# Patient Record
Sex: Female | Born: 1960 | Race: Black or African American | Hispanic: No | Smoking: Former smoker
Health system: Southern US, Community
[De-identification: ages and names within clinical notes are randomized; demographics above are authoritative.]

## PROBLEM LIST (undated history)

## (undated) DIAGNOSIS — I10 Essential (primary) hypertension: Secondary | ICD-10-CM

## (undated) DIAGNOSIS — E7800 Pure hypercholesterolemia, unspecified: Secondary | ICD-10-CM

---

## 2011-01-25 ENCOUNTER — Emergency Department (HOSPITAL_BASED_OUTPATIENT_CLINIC_OR_DEPARTMENT_OTHER)
Admission: EM | Admit: 2011-01-25 | Discharge: 2011-01-25 | Disposition: A | Discharge status: Home / Self Care | Payer: Federal, State, Local not specified - PPO | Attending: Emergency Medicine | Admitting: Emergency Medicine

## 2011-01-25 ENCOUNTER — Emergency Department (INDEPENDENT_AMBULATORY_CARE_PROVIDER_SITE_OTHER): Payer: Federal, State, Local not specified - PPO

## 2011-01-25 ENCOUNTER — Encounter: Payer: Self-pay | Admitting: Emergency Medicine

## 2011-01-25 DIAGNOSIS — R110 Nausea: Secondary | ICD-10-CM | POA: Insufficient documentation

## 2011-01-25 DIAGNOSIS — R51 Headache: Secondary | ICD-10-CM

## 2011-01-25 MED ORDER — DEXAMETHASONE SODIUM PHOSPHATE 10 MG/ML IJ SOLN
10.0000 mg | Freq: Once | INTRAMUSCULAR | Status: AC
  Administered 2011-01-25: 10 mg via INTRAMUSCULAR
  Filled 2011-01-25: qty 1

## 2011-01-25 MED ORDER — DIPHENHYDRAMINE HCL 50 MG/ML IJ SOLN
25.0000 mg | Freq: Once | INTRAMUSCULAR | Status: AC
Start: 2011-01-25 — End: 2011-01-25
  Administered 2011-01-25: 25 mg via INTRAVENOUS
  Filled 2011-01-25: qty 1

## 2011-01-25 MED ORDER — METOCLOPRAMIDE HCL 5 MG/ML IJ SOLN
10.0000 mg | Freq: Once | INTRAMUSCULAR | Status: AC
  Administered 2011-01-25: 10 mg via INTRAMUSCULAR
  Filled 2011-01-25: qty 2

## 2011-01-25 NOTE — ED Provider Notes (Signed)
History     CSN: [id number] Arrival date & time: 01/25/2011 11:57 AM  Chief Complaint  Patient presents with  . Headache    pt reports headaches with numbness to extremities     (Consider location/radiation/quality/duration/timing/severity/associated sxs/prior treatment) HPI Comments: Pt states that she just feels like she is dragging this morning:pt states that she doesn't usually have headaches that last this long:pt states that she was hyperventilating this morning and both hands were numb and tingling, but that sensation has resolved:pt denies as visually changes, speech changes or weakness to extremities  Patient is a 50 y.o. female presenting with headaches. The history is provided by the patient. No language interpreter was used.  Headache  This is a new problem. The current episode started 6 to 12 hours ago. The problem occurs constantly. The problem has not changed since onset.The headache is associated with emotional stress. The pain is located in the right unilateral region. The quality of the pain is described as sharp. The pain is severe. The pain radiates to the face. Associated symptoms include nausea. Pertinent negatives include no fever, no near-syncope, no shortness of breath and no vomiting. She has tried NSAIDs for the symptoms. The treatment provided moderate relief.    History reviewed. No pertinent past medical history.  History reviewed. No pertinent past surgical history.  Family History  Problem Relation Age of Onset  . Diabetes Mother   . Hyperlipidemia Father     History  Substance Use Topics  . Smoking status: Never Smoker   . Smokeless tobacco: Not on file  . Alcohol Use: No    OB History    Grav Para Term Preterm Abortions TAB SAB Ect Mult Living                  Review of Systems  Constitutional: Negative for fever.  Respiratory: Negative for shortness of breath.   Cardiovascular: Negative for near-syncope.  Gastrointestinal: Positive for  nausea. Negative for vomiting.  Neurological: Positive for headaches.  All other systems reviewed and are negative.    Allergies  Aspirin  Home Medications  No current outpatient prescriptions on file.  BP 159/82  Pulse 75  Temp(Src) 98.2 F (36.8 C) (Oral)  Resp 20  SpO2 100%  Physical Exam  Nursing note and vitals reviewed. Constitutional: She is oriented to person, place, and time. She appears well-developed and well-nourished.  HENT:  Head: Normocephalic and atraumatic.  Right Ear: External ear normal.  Left Ear: External ear normal.  Mouth/Throat: Oropharynx is clear and moist.  Eyes: Pupils are equal, round, and reactive to light.  Neck: Normal range of motion. Neck supple.  Cardiovascular: Normal rate and regular rhythm.   Pulmonary/Chest: Effort normal and breath sounds normal.  Abdominal: Soft. Bowel sounds are normal.  Musculoskeletal: Normal range of motion.  Neurological: She is alert and oriented to person, place, and time.  Skin: Skin is warm and dry.  Psychiatric: She has a normal mood and affect.    ED Course  Procedures (including critical care time)  Labs Reviewed - No data to display Ct Head Wo Contrast  01/25/2011  *RADIOLOGY REPORT*  Clinical Data: Headaches, nausea  CT HEAD WITHOUT CONTRAST  Technique:  Contiguous axial images were obtained from the base of the skull through the vertex without contrast.  Comparison: None.  Findings: No evidence of parenchymal hemorrhage or extra-axial fluid collection. No mass lesion, mass effect, or midline shift.  No CT evidence of acute infarction.  Cerebral volume  is age appropriate.  No ventriculomegaly.  The visualized paranasal sinuses are essentially clear. The mastoid air cells are unopacified.  No evidence of calvarial fracture.  IMPRESSION: Normal head CT.  Original Report Authenticated By: Charline Bills, M.D.     1. Headache       MDM  Pt is feeling better at this time:no acute findings  noted:pt not having any neuro deficits        Teressa Lower, NP 01/25/11 1351

## 2011-01-25 NOTE — ED Provider Notes (Signed)
Medical screening examination/treatment/procedure(s) were performed by non-physician practitioner and as supervising physician I was immediately available for consultation/collaboration.   Carl Bleecker A Terance Pomplun, MD 01/25/11 1927 

## 2011-01-25 NOTE — ED Notes (Signed)
Care plan and follow up reviewed with Pt pleasant pain free

## 2011-01-25 NOTE — ED Notes (Signed)
Pt reports headache with hyperventilation and numbness to extremities

## 2011-01-25 NOTE — ED Notes (Signed)
CO Headache and Numbness

## 2016-09-13 ENCOUNTER — Emergency Department (HOSPITAL_BASED_OUTPATIENT_CLINIC_OR_DEPARTMENT_OTHER): Payer: Federal, State, Local not specified - PPO

## 2016-09-13 ENCOUNTER — Encounter (HOSPITAL_BASED_OUTPATIENT_CLINIC_OR_DEPARTMENT_OTHER): Payer: Self-pay | Admitting: Emergency Medicine

## 2016-09-13 ENCOUNTER — Emergency Department (HOSPITAL_BASED_OUTPATIENT_CLINIC_OR_DEPARTMENT_OTHER)
Admission: EM | Admit: 2016-09-13 | Discharge: 2016-09-13 | Disposition: A | Discharge status: Home / Self Care | Payer: Federal, State, Local not specified - PPO | Attending: Emergency Medicine | Admitting: Emergency Medicine

## 2016-09-13 DIAGNOSIS — S3992XA Unspecified injury of lower back, initial encounter: Secondary | ICD-10-CM | POA: Diagnosis present

## 2016-09-13 DIAGNOSIS — Y939 Activity, unspecified: Secondary | ICD-10-CM | POA: Insufficient documentation

## 2016-09-13 DIAGNOSIS — S39012A Strain of muscle, fascia and tendon of lower back, initial encounter: Secondary | ICD-10-CM | POA: Insufficient documentation

## 2016-09-13 DIAGNOSIS — Y999 Unspecified external cause status: Secondary | ICD-10-CM | POA: Diagnosis not present

## 2016-09-13 DIAGNOSIS — Y929 Unspecified place or not applicable: Secondary | ICD-10-CM | POA: Insufficient documentation

## 2016-09-13 MED ORDER — IBUPROFEN 600 MG PO TABS: 600.0000 mg | ORAL_TABLET | Freq: Four times a day (QID) | ORAL | 0 refills | Status: AC | PRN

## 2016-09-13 MED ORDER — METHOCARBAMOL 500 MG PO TABS: 500.0000 mg | ORAL_TABLET | Freq: Two times a day (BID) | ORAL | 0 refills | Status: AC

## 2016-09-13 MED ORDER — IBUPROFEN 400 MG PO TABS
600.0000 mg | ORAL_TABLET | Freq: Once | ORAL | Status: AC
  Administered 2016-09-13: 600 mg via ORAL
  Filled 2016-09-13: qty 1
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
## 2018-03-04 ENCOUNTER — Emergency Department (HOSPITAL_BASED_OUTPATIENT_CLINIC_OR_DEPARTMENT_OTHER)
Admission: EM | Admit: 2018-03-04 | Discharge: 2018-03-04 | Disposition: A | Discharge status: Home / Self Care | Payer: Federal, State, Local not specified - PPO | Attending: Emergency Medicine | Admitting: Emergency Medicine

## 2018-03-04 ENCOUNTER — Other Ambulatory Visit: Payer: Self-pay

## 2018-03-04 ENCOUNTER — Encounter (HOSPITAL_BASED_OUTPATIENT_CLINIC_OR_DEPARTMENT_OTHER): Payer: Self-pay

## 2018-03-04 ENCOUNTER — Emergency Department (HOSPITAL_BASED_OUTPATIENT_CLINIC_OR_DEPARTMENT_OTHER): Payer: Federal, State, Local not specified - PPO

## 2018-03-04 DIAGNOSIS — I10 Essential (primary) hypertension: Secondary | ICD-10-CM | POA: Insufficient documentation

## 2018-03-04 DIAGNOSIS — R079 Chest pain, unspecified: Secondary | ICD-10-CM | POA: Insufficient documentation

## 2018-03-04 DIAGNOSIS — Z87891 Personal history of nicotine dependence: Secondary | ICD-10-CM | POA: Insufficient documentation

## 2018-03-04 HISTORY — DX: Essential (primary) hypertension: I10

## 2018-03-04 HISTORY — DX: Pure hypercholesterolemia, unspecified: E78.00

## 2018-03-04 LAB — CBC
HCT: 45.4 % (ref 36.0–46.0)
Hemoglobin: 14.5 g/dL (ref 12.0–15.0)
MCH: 30.7 pg (ref 26.0–34.0)
MCHC: 31.9 g/dL (ref 30.0–36.0)
MCV: 96 fL (ref 80.0–100.0)
PLATELETS: 233 10*3/uL (ref 150–400)
RBC: 4.73 MIL/uL (ref 3.87–5.11)
RDW: 13.8 % (ref 11.5–15.5)
WBC: 5.3 10*3/uL (ref 4.0–10.5)
nRBC: 0 % (ref 0.0–0.2)

## 2018-03-04 LAB — BASIC METABOLIC PANEL
Anion gap: 12 (ref 5–15)
BUN: 15 mg/dL (ref 6–20)
CALCIUM: 9.7 mg/dL (ref 8.9–10.3)
CHLORIDE: 105 mmol/L (ref 98–111)
CO2: 26 mmol/L (ref 22–32)
CREATININE: 0.8 mg/dL (ref 0.44–1.00)
GFR calc non Af Amer: >60 mL/min (ref >60)
Glucose, Bld: 87 mg/dL (ref 70–99)
Potassium: 4.6 mmol/L (ref 3.5–5.1)
SODIUM: 143 mmol/L (ref 135–145)

## 2018-03-04 LAB — TROPONIN I: Troponin I: <0.03 ng/mL (ref <0.03)

## 2018-03-04 NOTE — ED Triage Notes (Signed)
C/o CP x "over a week"-worse last night-seen by PCP today and sent to ED-pt NAD-steady gait

## 2018-03-04 NOTE — Discharge Instructions (Addendum)
Make sure that you follow-up with your primary care physician tomorrow.  Your initial blood work looks okay but you need further heart testing for your chest pain.  Start taking your blood pressure medicines that were prescribed by your primary care provider.  Return to the emergency room if you have any worsening symptoms or if you change your mind about further testing that was discussed.

## 2018-03-04 NOTE — ED Provider Notes (Signed)
[facility] EMERGENCY DEPARTMENT Provider Note   CSN: [id number] Arrival date & time: 03/04/18  1520     History   Chief Complaint Chief Complaint  Patient presents with  . Chest Pain    HPI Kelsey Hubbard is a 57 y.o. female.  Patient is a 57 year old female with a history of hypertension and hyperlipidemia who presents with elevated blood pressures.  She was diagnosed with hypertension about a year ago and was prescribed medication but she never started taking it.  She was trying to control her blood pressure and other ways.  She states that she has had some intermittent chest pain which she describes as pressure across her chest for several months.  It seems to have been worse over the last week.  She has had some increased fatigue.  Her chest pain is been a little more constant but still is intermittent over the last 2 to 3 days.  She has been still exercising and she states that yesterday when she exercise she felt bad but it did not necessarily make her chest pain worse.  She has some vague shortness of breath associated with the discomfort.  She has some intermittent discomfort to the right side of her neck which she also says is been going on for the last several months.  It comes and goes and seems to be worse when she is more stressed.  She denies any current pain.  Last night when she had the chest pain she had some numbness in her right arm which has currently resolved.  She denies any other numbness or weakness to her extremities.  No balance issues.  No headache.  No vision changes.  No speech deficits.  She went to her PCPs office today and her blood pressure was noted to be over 200 systolic.  She was given a dose of oral clonidine and sent here for further treatment.     Past Medical History:  Diagnosis Date  . High cholesterol   . Hypertension     There are no active problems to display for this patient.   Past Surgical History:  Procedure Laterality  Date  . CESAREAN SECTION       OB History   None      Home Medications    Prior to Admission medications   Medication Sig Start Date End Date Taking? Authorizing Provider  ibuprofen (ADVIL,MOTRIN) 600 MG tablet Take 1 tablet (600 mg total) by mouth every 6 (six) hours as needed. 09/13/16   Law, Waylan Boga, PA-C  methocarbamol (ROBAXIN) 500 MG tablet Take 1 tablet (500 mg total) by mouth 2 (two) times daily. 09/13/16   Emi Holes, PA-C    Family History Family History  Problem Relation Age of Onset  . Diabetes Mother   . Hyperlipidemia Father     Social History Social History   Tobacco Use  . Smoking status: Former Games developer  . Smokeless tobacco: Never Used  Substance Use Topics  . Alcohol use: Yes    Comment: weekly  . Drug use: No     Allergies   Aspirin   Review of Systems Review of Systems  Constitutional: Positive for fatigue. Negative for chills, diaphoresis and fever.  HENT: Negative for congestion, rhinorrhea and sneezing.   Eyes: Negative.   Respiratory: Positive for shortness of breath. Negative for cough and chest tightness.   Cardiovascular: Positive for chest pain. Negative for leg swelling.  Gastrointestinal: Negative for abdominal pain, blood in stool, diarrhea,  nausea and vomiting.  Genitourinary: Negative for difficulty urinating, flank pain, frequency and hematuria.  Musculoskeletal: Negative for arthralgias and back pain.  Skin: Negative for rash.  Neurological: Positive for numbness. Negative for dizziness, speech difficulty, weakness and headaches.     Physical Exam Updated Vital Signs BP (!) 163/81   Pulse (!) 59   Temp 97.9 F (36.6 C) (Oral)   Resp 18   SpO2 100%   Physical Exam  Constitutional: She is oriented to person, place, and time. She appears well-developed and well-nourished.  HENT:  Head: Normocephalic and atraumatic.  Eyes: Pupils are equal, round, and reactive to light.  Neck: Normal range of motion. Neck  supple.  Cardiovascular: Normal rate, regular rhythm and normal heart sounds.  Pulmonary/Chest: Effort normal and breath sounds normal. No respiratory distress. She has no wheezes. She has no rales. She exhibits no tenderness.  Abdominal: Soft. Bowel sounds are normal. There is no tenderness. There is no rebound and no guarding.  Musculoskeletal: Normal range of motion. She exhibits no edema.  Lymphadenopathy:    She has no cervical adenopathy.  Neurological: She is alert and oriented to person, place, and time.  Motor 5/5 all extremities Sensation grossly intact to LT all extremities Finger to Nose intact, no pronator drift CN II-XII grossly intact    Skin: Skin is warm and dry. No rash noted.  Psychiatric: She has a normal mood and affect.     ED Treatments / Results  Labs (all labs ordered are listed, but only abnormal results are displayed) Labs Reviewed  CBC  TROPONIN I  BASIC METABOLIC PANEL    EKG EKG Interpretation  Date/Time:  Thursday March 04 2018 15:31:10 EST Ventricular Rate:  65 PR Interval:  148 QRS Duration: 86 QT Interval:  424 QTC Calculation: 440 R Axis:   70 Text Interpretation:  Normal sinus rhythm Normal ECG No old tracing to compare Confirmed by Rolan BuccoBelfi, Davian Hanshaw [phone]([id number]) on 03/04/2018 3:40:58 PM   Radiology Dg Chest 2 View  Result Date: 03/04/2018 CLINICAL DATA:  Chest pain. EXAM: CHEST - 2 VIEW COMPARISON:  None. FINDINGS: The heart size and mediastinal contours are within normal limits. Both lungs are clear. The visualized skeletal structures are unremarkable. IMPRESSION: Normal chest x-ray. Electronically Signed   By: Rudie MeyerP.  Gallerani M.D.   On: 03/04/2018 15:58    Procedures Procedures (including critical care time)  Medications Ordered in ED Medications - No data to display   Initial Impression / Assessment and Plan / ED Course  I have reviewed the triage vital signs and the nursing notes.  Pertinent labs & imaging results that were  available during my care of the patient were reviewed by me and considered in my medical decision making (see chart for details).     Patient's EKG does not show any acute ischemic changes.  Her troponin is negative.  Her other labs are non-concerning.  Her blood pressure is currently 163/81 so no further medications were given for her elevated blood pressures in the ED.  I did discuss the patient staying for a second troponin I advised her that we cannot do a complete heart evaluation here in the emergency room, particularly with one troponin.  She is refusing any further testing and says that she feels fine and is ready to go.  She is currently asymptomatic.  She is leaving AGAINST MEDICAL ADVICE.  I did discuss the risk of this and I advised her to follow-up with her primary care physician tomorrow.  She states that she will start taking her blood pressure medicine that was called into the pharmacy by her PCP.  She was advised to return to emergency room if she has any worsening symptoms or changes her mind about further evaluation.  Final Clinical Impressions(s) / ED Diagnoses   Final diagnoses:  Nonspecific chest pain  Hypertension, unspecified type    ED Discharge Orders    None       Rolan Bucco, MD 03/04/18 1758

## 2018-03-04 NOTE — ED Notes (Signed)
Attempted IV times 2 by 2 RN's unable to thread. Labs obtained and sent to lab. Pt does not want IV at this time.
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
## 2019-11-17 IMAGING — CR DG CHEST 2V
2 series · 2 of 2 positions shown · non-contrast
Comparison: None.

CLINICAL DATA: Chest pain.

EXAM:
CHEST - 2 VIEW

[w chest pa]
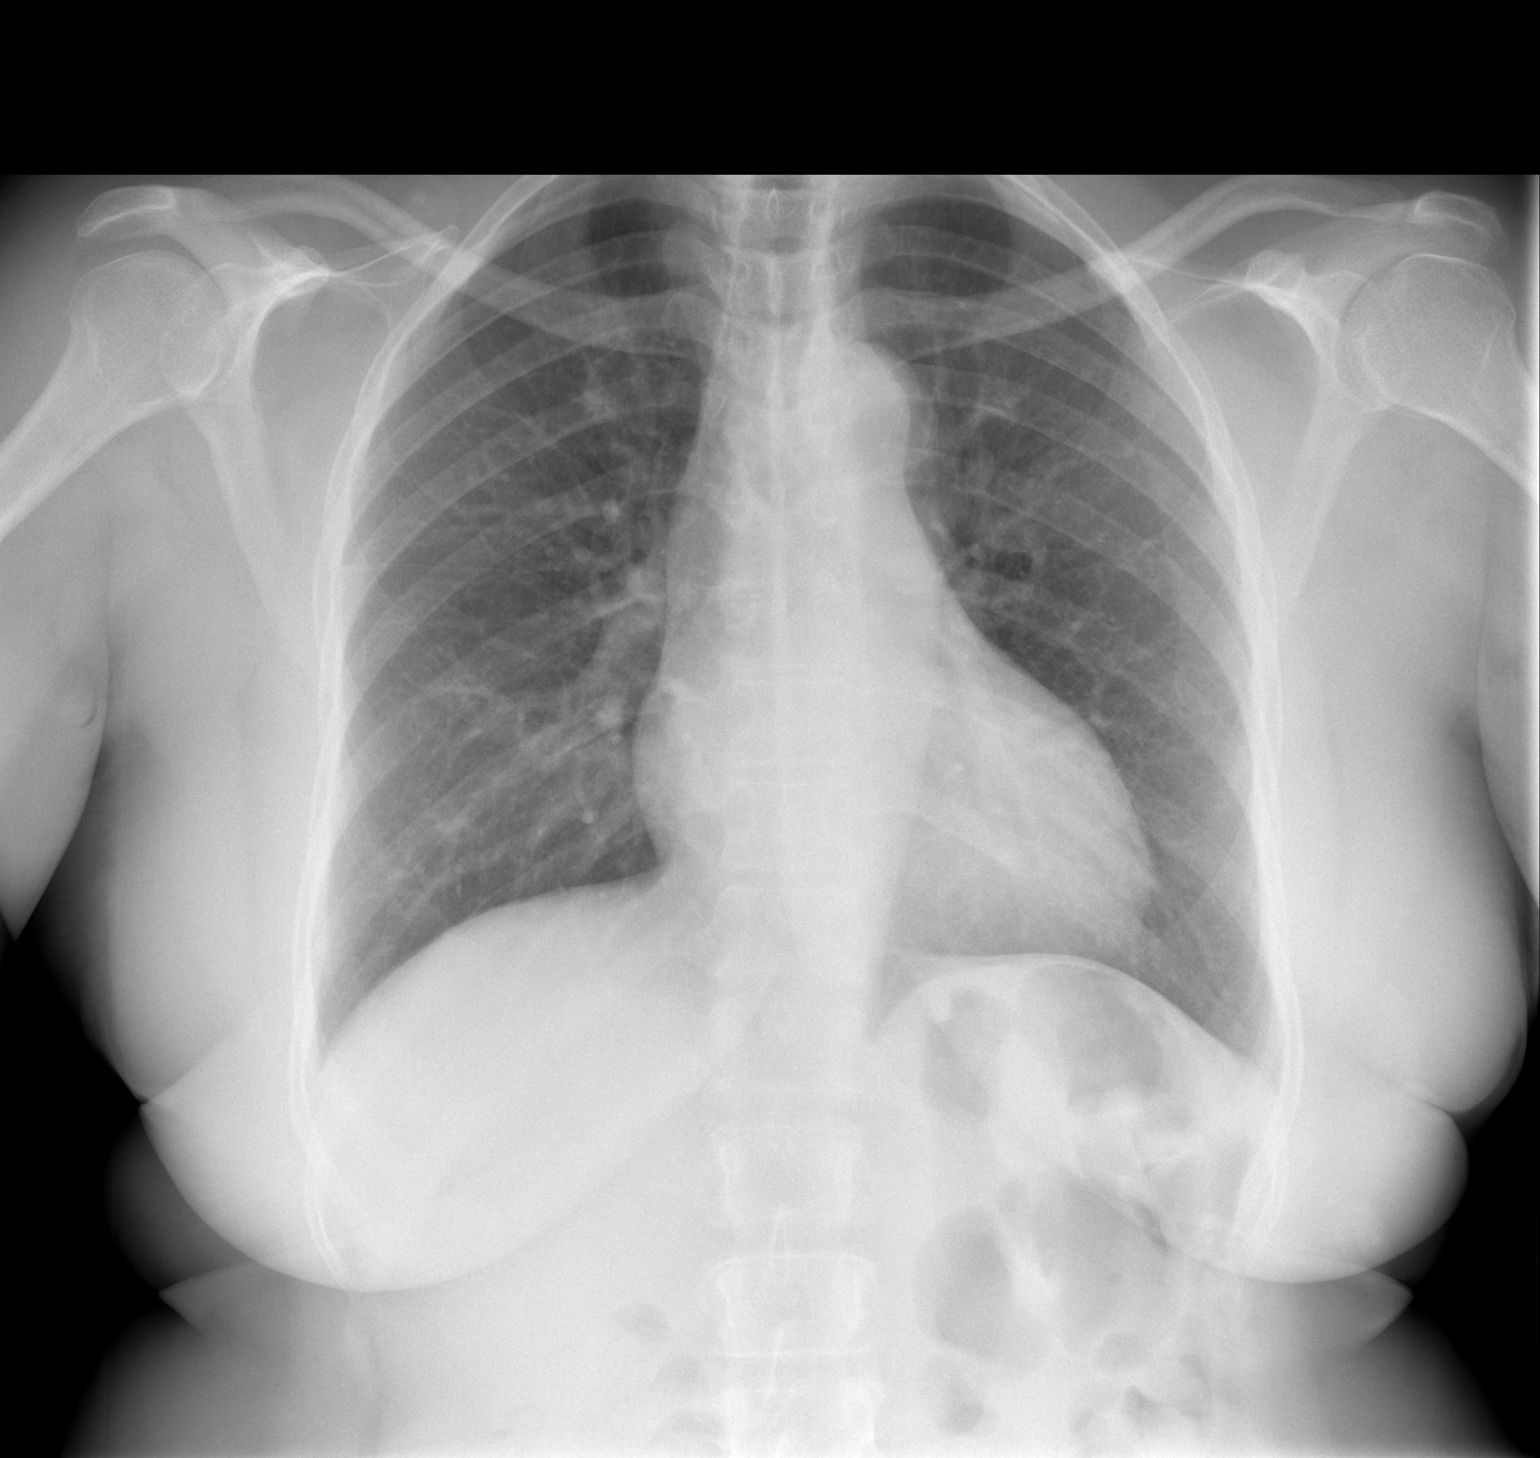

[w chest lat]
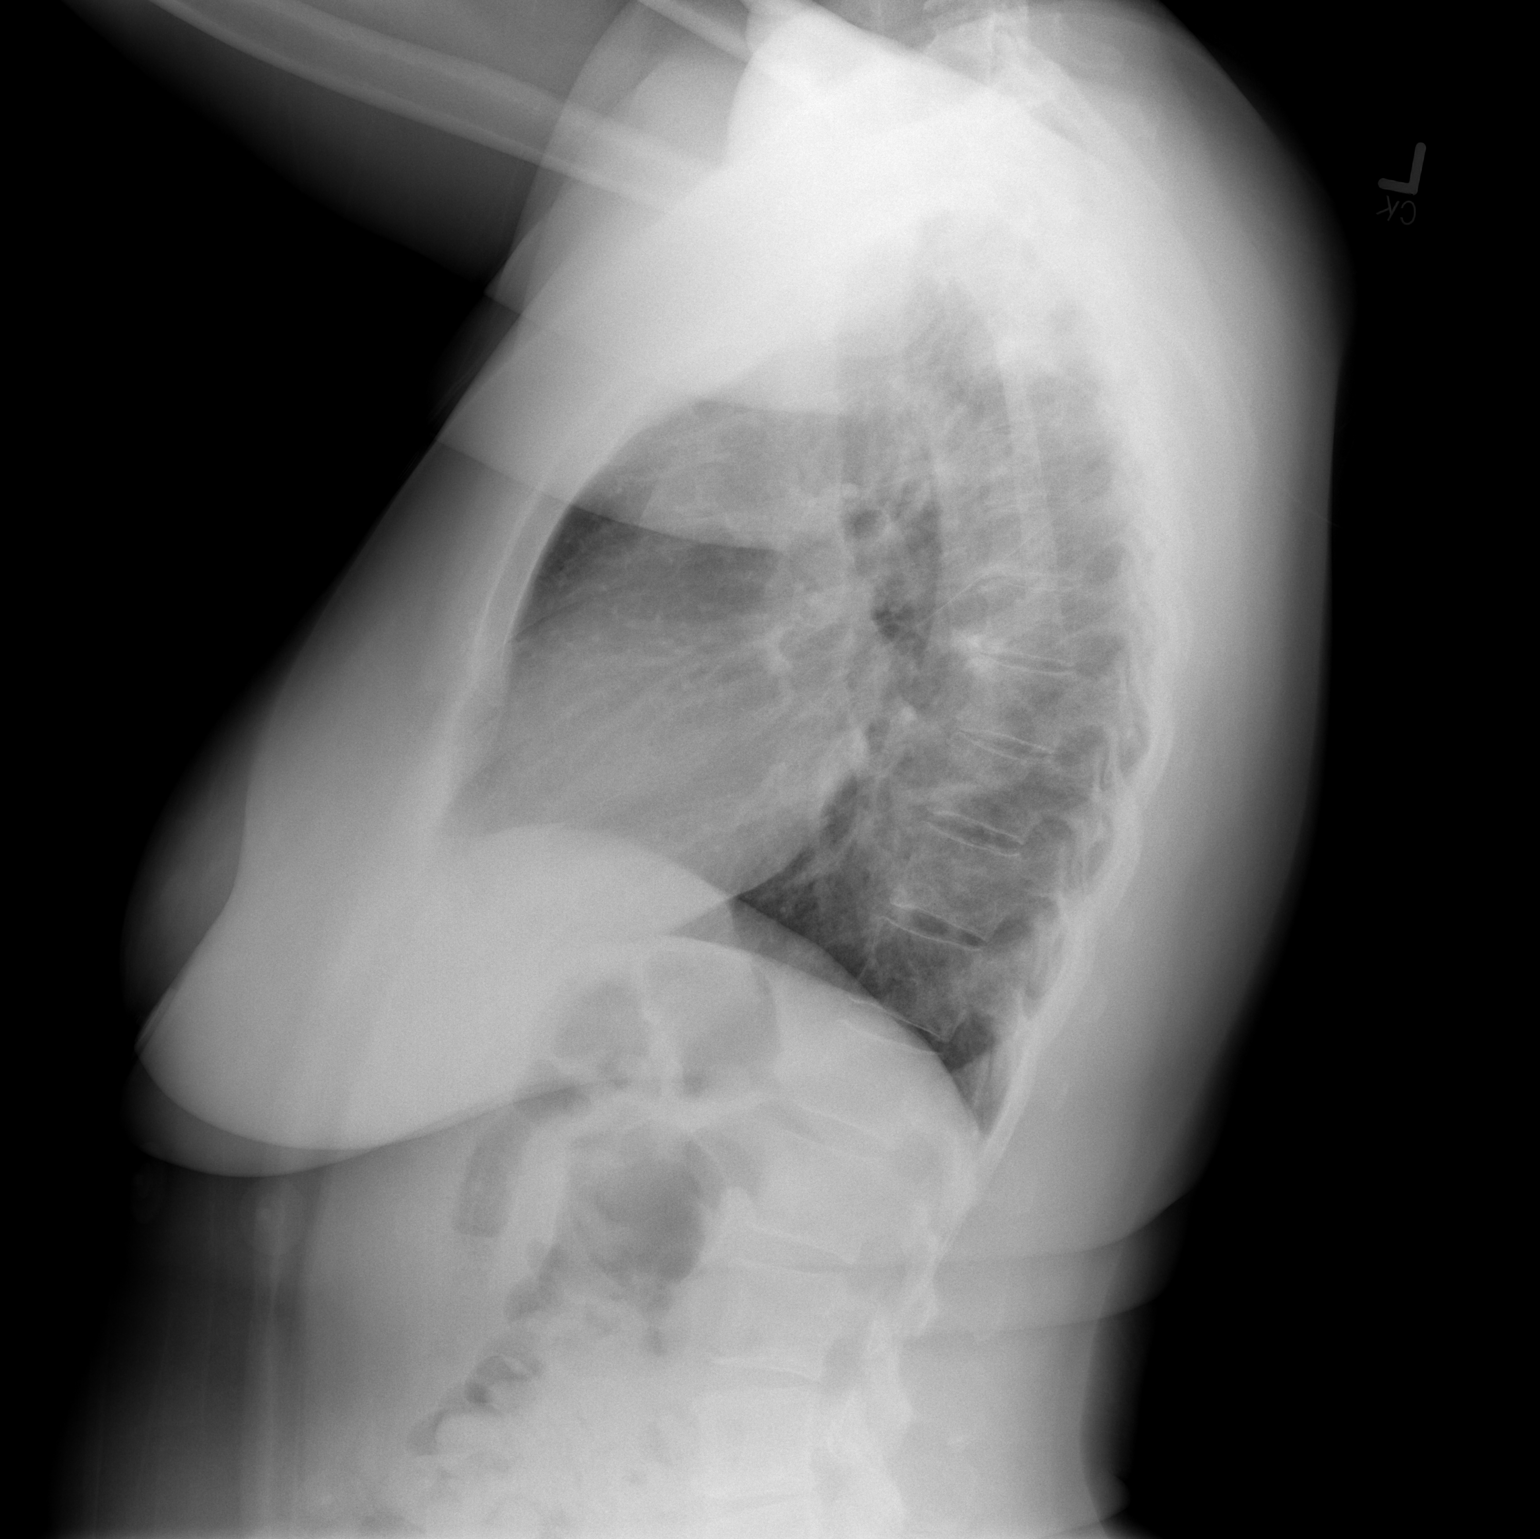

[2 of 2 positions shown; findings below may reference images not displayed]

FINDINGS: The heart size and mediastinal contours are within normal limits.
Both lungs are clear. The visualized skeletal structures are
unremarkable.
IMPRESSION: Normal chest x-ray.

## 2022-12-28 ENCOUNTER — Emergency Department (HOSPITAL_BASED_OUTPATIENT_CLINIC_OR_DEPARTMENT_OTHER)
Admission: EM | Admit: 2022-12-28 | Discharge: 2022-12-28 | Disposition: A | Discharge status: Home / Self Care | Payer: Federal, State, Local not specified - PPO | Attending: Emergency Medicine | Admitting: Emergency Medicine

## 2022-12-28 ENCOUNTER — Encounter (HOSPITAL_BASED_OUTPATIENT_CLINIC_OR_DEPARTMENT_OTHER): Payer: Self-pay | Admitting: Urology

## 2022-12-28 ENCOUNTER — Other Ambulatory Visit: Payer: Self-pay

## 2022-12-28 DIAGNOSIS — I10 Essential (primary) hypertension: Secondary | ICD-10-CM | POA: Insufficient documentation

## 2022-12-28 DIAGNOSIS — K5641 Fecal impaction: Secondary | ICD-10-CM | POA: Diagnosis present

## 2022-12-28 MED ORDER — FLEET ENEMA RE ENEM
1.0000 | ENEMA | Freq: Once | RECTAL | Status: AC
  Administered 2022-12-28: 1 via RECTAL
  Filled 2022-12-28: qty 1

## 2022-12-28 MED ORDER — BISACODYL 10 MG RE SUPP: 10.0000 mg | Freq: Once | RECTAL | Status: DC

## 2022-12-28 NOTE — ED Notes (Signed)
ED Provider at bedside. 

## 2022-12-28 NOTE — ED Provider Notes (Signed)
[facility] Provider Note   CSN: [id number] Arrival date & time: 12/28/22  1446     History  Chief Complaint  Patient presents with   Fecal Impaction    Kelsey Hubbard is a 62 y.o. female Who presents emergency department with chief complaint of fecal impaction.  Patient states that her symptoms came on today.  She tried disimpacting at home with a gloved hand and using an enema without relief of her symptoms.  Patient states that she thought she was drinking enough water but feels that she apparently has gotten somewhat dehydrated.  She has never had to come in for disimpaction before.  She complains of rectal urgency and pain in her bottom with feeling of stool in her rectum.  HPI     Home Medications Prior to Admission medications   Medication Sig Start Date End Date Taking? Authorizing Provider  ibuprofen (ADVIL,MOTRIN) 600 MG tablet Take 1 tablet (600 mg total) by mouth every 6 (six) hours as needed. 09/13/16   Law, Waylan Boga, PA-C  methocarbamol (ROBAXIN) 500 MG tablet Take 1 tablet (500 mg total) by mouth 2 (two) times daily. 09/13/16   Emi Holes, PA-C      Allergies    Aspirin    Review of Systems   Review of Systems  Physical Exam Updated Vital Signs BP (!) 160/94 (BP Location: Left Arm)   Pulse 95   Temp 97.6 F (36.4 C)   Resp (!) 22   Ht 5\' 2"  (1.575 m)   Wt 74.9 kg   SpO2 100%   BMI 30.19 kg/m  Physical Exam Vitals and nursing note reviewed.  Constitutional:      General: She is not in acute distress.    Appearance: She is well-developed. She is not diaphoretic.     Comments: Patient appears extremely uncomfortable  HENT:     Head: Normocephalic and atraumatic.     Right Ear: External ear normal.     Left Ear: External ear normal.     Nose: Nose normal.     Mouth/Throat:     Mouth: Mucous membranes are moist.  Eyes:     General: No scleral icterus.    Conjunctiva/sclera: Conjunctivae  normal.  Cardiovascular:     Rate and Rhythm: Normal rate and regular rhythm.     Heart sounds: Normal heart sounds. No murmur heard.    No friction rub. No gallop.  Pulmonary:     Effort: Pulmonary effort is normal. No respiratory distress.     Breath sounds: Normal breath sounds.  Abdominal:     General: Bowel sounds are normal. There is no distension.     Palpations: Abdomen is soft. There is no mass.     Tenderness: There is no abdominal tenderness. There is no guarding.  Genitourinary:    Comments: Large volume of firm stool in the rectal vault Musculoskeletal:     Cervical back: Normal range of motion.  Skin:    General: Skin is warm and dry.  Neurological:     Mental Status: She is alert and oriented to person, place, and time.  Psychiatric:        Behavior: Behavior normal.     ED Results / Procedures / Treatments   Labs (all labs ordered are listed, but only abnormal results are displayed) Labs Reviewed - No data to display  EKG None  Radiology No results found.  Procedures Fecal disimpaction  Date/Time: 12/28/2022 3:36  PM  Performed by: Arthor Captain, PA-C Authorized by: Arthor Captain, PA-C  Consent: Verbal consent obtained. Consent given by: patient Time out: Immediately prior to procedure a "time out" was called to verify the correct patient, procedure, equipment, support staff and site/side marked as required. Local anesthesia used: no  Anesthesia: Local anesthesia used: no Patient tolerance: patient tolerated the procedure well with no immediate complications       Medications Ordered in ED Medications - No data to display  ED Course/ Medical Decision Making/ A&P                                 Medical Decision Making This patient presents to the ED for concern of fecal impaction, this involves an extensive number of treatment options, and is a complaint that carries with it a high risk of complications and morbidity.  Stercoralcolitis,  proctitis, sigmoid volvulus, other pelvic inflammation.  Co morbidities:       has a past medical history of High cholesterol and Hypertension.   Social Determinants of Health:       SDOH Screenings Social Connections: Unknown (08/23/2021)     Received from [facility] Tobacco Use: Medium Risk (12/28/2022)   Additional history:  {Additional history obtained from emr {External records from outside source obtained and reviewed including Family medicine notes   I ordered medication including Medications sodium phosphate (FLEET) enema 1 enema (has no administration in time range) for impaction Reevaluation of the patient after these medicines showed that the patient resolved-patient able to have a large volume bowel movement I have reviewed the patients home medicines and have made adjustments as needed  Test Considered:       I considered CT imaging however patient's symptoms are classic for fecal impaction.  Critical Interventions:       Disimpaction  Consultations Obtained: None  Problem List / ED Course:       Fecal impaction  MDM: Patient with fecal impaction.  Gust bowel regimen, fluid intake recommend MiraLAX.  Discussed outpatient follow-up and return precautions.   Dispostion:  After consideration of the diagnostic results and the patients response to treatment, I feel that the patent would benefit from discharge .    Risk OTC drugs.           Final Clinical Impression(s) / ED Diagnoses Final diagnoses:  None    Rx / DC Orders ED Discharge Orders     None         Arthor Captain, PA-C 12/28/22 1704    Terald Sleeper, MD 12/28/22 [phone]

## 2022-12-28 NOTE — Discharge Instructions (Signed)
1) take 1 capful of Miralax 1-2 times a day with a full glass of water daily to ensure that you are making soft, fullvolume bowel movements.  2) make sure you are drinking at least 80 oz of water a day base on your current weight  3) Take at least 25 -30g of fiber a day. You may track your fiber intake on [employer] for free I recommend Benefiber, Citrucel and High Fiber wraps.  Contact a health care provider if you: Have ongoing pain in your rectum. Need to use an enema or a suppository more than 2 times a week. Have rectal bleeding. Continue to have problems. The problems may include not being able to go to the bathroom and having long-term constipation. Have pain in your abdomen. Have thin, pencil-like stools. Get help right away if: You have black or tarry stools.

## 2022-12-28 NOTE — ED Triage Notes (Signed)
Pt states she is Impacted, states last BM 3-4 days  Pt tried enema with no relief approx 1330  Pt states liquid coming from rectum with extreme pressure.
